# Patient Record
Sex: Male | Born: 1975
Health system: Southern US, Community
[De-identification: ages and names within clinical notes are randomized; demographics above are authoritative.]

## PROBLEM LIST (undated history)

## (undated) DIAGNOSIS — S3421XA Injury of nerve root of lumbar spine, initial encounter: Secondary | ICD-10-CM

## (undated) DIAGNOSIS — G4730 Sleep apnea, unspecified: Secondary | ICD-10-CM

## (undated) HISTORY — DX: Sleep apnea, unspecified: G47.30

## (undated) HISTORY — DX: Injury of nerve root of lumbar spine, initial encounter: S34.21XA

---

## 2006-03-26 ENCOUNTER — Encounter: Admission: RE | Admit: 2006-03-26 | Discharge: 2006-03-26 | Payer: Self-pay | Admitting: Family Medicine

## 2007-02-17 ENCOUNTER — Ambulatory Visit: Payer: Self-pay | Admitting: Orthopedic Surgery

## 2014-10-23 ENCOUNTER — Emergency Department: Payer: Self-pay | Admitting: Emergency Medicine

## 2014-10-26 ENCOUNTER — Ambulatory Visit: Payer: Self-pay | Admitting: Family Medicine

## 2014-11-08 ENCOUNTER — Ambulatory Visit: Payer: Self-pay | Admitting: Urology

## 2014-11-15 ENCOUNTER — Ambulatory Visit: Admit: 2014-11-15 | Disposition: A | Discharge status: Home / Self Care | Payer: Self-pay | Admitting: Urology

## 2016-10-15 ENCOUNTER — Emergency Department: Payer: BLUE CROSS/BLUE SHIELD

## 2016-10-15 ENCOUNTER — Emergency Department
Admission: EM | Admit: 2016-10-15 | Discharge: 2016-10-15 | Disposition: A | Discharge status: Home / Self Care | Payer: BLUE CROSS/BLUE SHIELD | Attending: Emergency Medicine | Admitting: Emergency Medicine

## 2016-10-15 ENCOUNTER — Encounter: Payer: Self-pay | Admitting: Family Medicine

## 2016-10-15 ENCOUNTER — Encounter: Payer: Self-pay | Admitting: Emergency Medicine

## 2016-10-15 ENCOUNTER — Ambulatory Visit (INDEPENDENT_AMBULATORY_CARE_PROVIDER_SITE_OTHER): Payer: BLUE CROSS/BLUE SHIELD | Admitting: Family Medicine

## 2016-10-15 VITALS — BP 114/73 | HR 90 | Temp 98.3°F | Ht 66.5 in | Wt 232.9 lb

## 2016-10-15 DIAGNOSIS — R079 Chest pain, unspecified: Secondary | ICD-10-CM

## 2016-10-15 DIAGNOSIS — Z7982 Long term (current) use of aspirin: Secondary | ICD-10-CM | POA: Insufficient documentation

## 2016-10-15 DIAGNOSIS — R0902 Hypoxemia: Secondary | ICD-10-CM

## 2016-10-15 DIAGNOSIS — R202 Paresthesia of skin: Secondary | ICD-10-CM | POA: Diagnosis not present

## 2016-10-15 DIAGNOSIS — R6883 Chills (without fever): Secondary | ICD-10-CM | POA: Diagnosis not present

## 2016-10-15 DIAGNOSIS — Z13220 Encounter for screening for lipoid disorders: Secondary | ICD-10-CM

## 2016-10-15 DIAGNOSIS — F17290 Nicotine dependence, other tobacco product, uncomplicated: Secondary | ICD-10-CM | POA: Insufficient documentation

## 2016-10-15 LAB — BASIC METABOLIC PANEL
Anion gap: 8 (ref 5–15)
BUN: 18 mg/dL (ref 6–20)
CHLORIDE: 104 mmol/L (ref 101–111)
CO2: 27 mmol/L (ref 22–32)
CREATININE: 1.04 mg/dL (ref 0.61–1.24)
Calcium: 9.2 mg/dL (ref 8.9–10.3)
GFR calc non Af Amer: >60 mL/min (ref >60)
Glucose, Bld: 115 mg/dL — ABNORMAL HIGH (ref 65–99)
Potassium: 3.8 mmol/L (ref 3.5–5.1)
Sodium: 139 mmol/L (ref 135–145)

## 2016-10-15 LAB — CBC
HEMATOCRIT: 42.6 % (ref 40.0–52.0)
HEMOGLOBIN: 14.7 g/dL (ref 13.0–18.0)
MCH: 29.9 pg (ref 26.0–34.0)
MCHC: 34.5 g/dL (ref 32.0–36.0)
MCV: 86.8 fL (ref 80.0–100.0)
PLATELETS: 300 10*3/uL (ref 150–440)
RBC: 4.91 MIL/uL (ref 4.40–5.90)
RDW: 12.5 % (ref 11.5–14.5)
WBC: 9.7 10*3/uL (ref 3.8–10.6)

## 2016-10-15 LAB — UA/M W/RFLX CULTURE, ROUTINE
Bilirubin, UA: NEGATIVE
GLUCOSE, UA: NEGATIVE
Ketones, UA: NEGATIVE
Leukocytes, UA: NEGATIVE
Nitrite, UA: NEGATIVE
Protein, UA: NEGATIVE
Specific Gravity, UA: 1.01 (ref 1.005–1.030)
UUROB: 0.2 mg/dL (ref 0.2–1.0)
pH, UA: 5.5 (ref 5.0–7.5)

## 2016-10-15 LAB — TROPONIN I
Troponin I: <0.03 ng/mL (ref <0.03)
Troponin I: <0.03 ng/mL (ref <0.03)

## 2016-10-15 LAB — BAYER DCA HB A1C WAIVED: HB A1C (BAYER DCA - WAIVED): 5.4 % (ref <7.0)

## 2016-10-15 MED ORDER — ASPIRIN 325 MG PO TABS: 325.0000 mg | ORAL_TABLET | Freq: Once | ORAL | Status: AC

## 2016-10-15 NOTE — ED Provider Notes (Signed)
[facility] Emergency Department Provider Note  ____________________________________________  Time seen: Approximately 7:29 PM  I have reviewed the triage vital signs and the nursing notes.   HISTORY  Chief Complaint Chest Pain   HPI John Willis is a 41 y.o. male with history of obstructive sleep apnea on CPAP who presents from primary care's office for evaluation of EKG changes. Patient went there today for chest pain. Patient reports that since Christmas he has had 3 episodes of left-sided chest pain that he describes as a sharp pain located in his chest radiating down to his left arm. 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 The pain then resolved and he has had 2 other episodes happen while he was working exerting himself. Patient reports that he works out lifting heavy weights and aerobic exercises 3-4 times a week and has no chest pain or shortness of breath while working out. He denies family history of ischemic heart disease. He says that he smokes a cigar once every couple weeks. He denies any chest pain today. His last episode of chest pain was 3 days ago. He went to see primary care doctor today who did an EKG showing T-wave inversions in inferior lateral leads and patient was sent here for further evaluation. Patient denies having any associated symptoms at the chest pain such as diaphoresis, nausea, vomiting, shortness of breath or dizziness. Patient is also complaining of episodes where he wakes up in the middle of the night feeling like his body temperature has dropped to below 0. He shivers and has to get into a hot shower for 45 minutes to stop the shivering. He denies unintentional weight loss. He did live in [place] a kid. Denies any other exposure to TB such as jail for homeless shelters. He has been having these episodes for 2 years and reports that he has them couple times a  year. The last one was also 3 days ago concurrent with the chest pain.No hemoptysis, no SOB, no flulike symptoms.  Past Medical History:  Diagnosis Date  . 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 Sleep apnea     There are no active problems to display for this patient.   History reviewed. No pertinent surgical history.  Prior to Admission medications   Medication Sig Start Date End Date Taking? Authorizing Provider  ibuprofen (ADVIL,MOTRIN) 800 MG tablet Take by mouth.    Historical Provider, MD    Allergies Patient has no known allergies.  Family History  Problem Relation Age of Onset  . Heart disease Paternal Grandfather     Social History Social History  Substance Use Topics  . Smoking status: Light Tobacco Smoker    Types: Cigars  . Smokeless tobacco: Never Used  . Alcohol use Yes     Comment: Occassionally    Review of Systems  Constitutional: Negative for fever. Eyes: Negative for visual changes. ENT: Negative for sore throat. Neck: No neck pain  Cardiovascular: + chest pain. Respiratory: Negative for shortness of breath. Gastrointestinal: Negative for abdominal pain, vomiting or diarrhea. Genitourinary: Negative for dysuria. Musculoskeletal: Negative for back pain. Skin: Negative for rash. Neurological: Negative for headaches, weakness or numbness. Psych: No SI or HI  ____________________________________________   PHYSICAL EXAM:  VITAL SIGNS: ED Triage Vitals  Enc Vitals Group     BP 10/15/16 1713 136/78     Pulse  Rate 10/15/16 1713 87     Resp 10/15/16 1713 18     Temp 10/15/16 1713 98.2 F (36.8 C)     Temp Source 10/15/16 1713 Oral     SpO2 10/15/16 1713 95 %     Weight 10/15/16 1715 230 lb (104.3 kg)     Height 10/15/16 1715 5\' 6"  (1.676 m)     Head Circumference --      Peak Flow --      Pain Score --      Pain Loc --      Pain Edu? --      Excl. in GC? --     Constitutional: Alert and oriented. Well appearing and in no  apparent distress. HEENT:      Head: Normocephalic and atraumatic.         Eyes: Conjunctivae are normal. Sclera is non-icteric. EOMI. PERRL      Mouth/Throat: Mucous membranes are moist.       Neck: Supple with no signs of meningismus. Cardiovascular: Regular rate and rhythm. No murmurs, gallops, or rubs. 2+ symmetrical distal pulses are present in all extremities. No JVD.  Respiratory: Normal respiratory effort. Lungs are clear to auscultation bilaterally. No wheezes, crackles, or rhonchi.  Gastrointestinal: Soft, non tender, and non distended with positive bowel sounds. No rebound or guarding. Musculoskeletal: Nontender with normal range of motion in all extremities. No edema, cyanosis, or erythema of extremities. Neurologic: Normal speech and language. Face is symmetric. Moving all extremities. No gross focal neurologic deficits are appreciated. Skin: Skin is warm, dry and intact. No rash noted. Psychiatric: Mood and affect are normal. Speech and behavior are normal.  ____________________________________________   LABS (all labs ordered are listed, but only abnormal results are displayed)  Labs Reviewed  BASIC METABOLIC PANEL - Abnormal; Notable for the following:       Result Value   Glucose, Bld 115 (*)    All other components within normal limits  CBC  TROPONIN I  TROPONIN I   ____________________________________________  EKG  ED ECG REPORT I, Nita Sickle, the attending physician, personally viewed and interpreted this ECG.  Normal sinus rhythm, rate of 77, normal intervals, normal axis, no ST elevations or depressions, flattening T wave in lead 3. When compared to EKG from primary care's office T-wave inversions in 1, 2, and aVL are no longer seen ____________________________________________  RADIOLOGY  CXR: Negative  ____________________________________________   PROCEDURES  Procedure(s) performed: None Procedures Critical Care performed:   None ____________________________________________   INITIAL IMPRESSION / ASSESSMENT AND PLAN / ED COURSE   41 y.o. male with history of obstructive sleep apnea on CPAP who presents from primary care's office for evaluation of EKG changes in the setting of 3 episodes of atypical chest pain. No chest pain for the last 3 days. Patient has EKG from the PCPs office and I do see T-wave inversions on 1,2 and aVL that are no longer present upon arrival to the emergency room. The EKG was done while patient was asymptomatic. His troponin x2 is negative. His vital signs are within normal limits. Physical exam is with no acute findings. No CP in the ED. Discussed patient's presentation, EKG findings, lab work, and history with Dr. Gwen Pounds, cardiologist on call who recommended outpatient follow up in his office tomorrow morning. Patient given full dose ASA at PCP's office. Will dc home with close follow-up with cardiology and return to the emergency room if patient develops chest pain.  Pertinent labs & imaging results that were available during my care of the patient were reviewed by me and considered in my medical decision making (see chart for details).    ____________________________________________   FINAL CLINICAL IMPRESSION(S) / ED DIAGNOSES  Final diagnoses:  Chest pain, unspecified type      NEW MEDICATIONS STARTED DURING THIS VISIT:  New Prescriptions   No medications on file     Note:  This document was prepared using Dragon voice recognition software and may include unintentional dictation errors.    Nita Sicklearolina Leona Alen, MD 10/15/16 2102
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
## 2016-10-15 NOTE — ED Triage Notes (Signed)
Pt reports intermittent left sided chest pain that radiates to left arm for several months. Pt reports he notices the pain more when he has been very active. Pt reports night sweats.

## 2016-10-15 NOTE — Discharge Instructions (Signed)

## 2016-10-16 LAB — CBC WITH DIFFERENTIAL/PLATELET
BASOS ABS: 0 10*3/uL (ref 0.0–0.2)
BASOS: 0 %
EOS (ABSOLUTE): 0.2 10*3/uL (ref 0.0–0.4)
Eos: 2 %
Hematocrit: 43.3 % (ref 37.5–51.0)
Hemoglobin: 14.7 g/dL (ref 13.0–17.7)
Immature Grans (Abs): 0 10*3/uL (ref 0.0–0.1)
Immature Granulocytes: 0 %
LYMPHS ABS: 2 10*3/uL (ref 0.7–3.1)
LYMPHS: 20 %
MCH: 29.5 pg (ref 26.6–33.0)
MCHC: 33.9 g/dL (ref 31.5–35.7)
MCV: 87 fL (ref 79–97)
MONOS ABS: 0.7 10*3/uL (ref 0.1–0.9)
Monocytes: 7 %
NEUTROS ABS: 7.1 10*3/uL — AB (ref 1.4–7.0)
Neutrophils: 71 %
PLATELETS: 327 10*3/uL (ref 150–379)
RBC: 4.98 x10E6/uL (ref 4.14–5.80)
RDW: 13.3 % (ref 12.3–15.4)
WBC: 10.1 10*3/uL (ref 3.4–10.8)

## 2016-10-16 LAB — THYROID PANEL WITH TSH
Free Thyroxine Index: 3.4 (ref 1.2–4.9)
T3 UPTAKE RATIO: 35 % (ref 24–39)
T4 TOTAL: 9.7 ug/dL (ref 4.5–12.0)
TSH: 1.78 u[IU]/mL (ref 0.450–4.500)

## 2016-10-16 LAB — COMPREHENSIVE METABOLIC PANEL
A/G RATIO: 1.9 (ref 1.2–2.2)
ALK PHOS: 87 IU/L (ref 39–117)
ALT: 32 IU/L (ref 0–44)
AST: 28 IU/L (ref 0–40)
Albumin: 4.7 g/dL (ref 3.5–5.5)
BILIRUBIN TOTAL: 0.3 mg/dL (ref 0.0–1.2)
BUN/Creatinine Ratio: 15 (ref 9–20)
BUN: 16 mg/dL (ref 6–24)
CHLORIDE: 98 mmol/L (ref 96–106)
CO2: 25 mmol/L (ref 18–29)
Calcium: 9.6 mg/dL (ref 8.7–10.2)
Creatinine, Ser: 1.05 mg/dL (ref 0.76–1.27)
GFR calc Af Amer: 101 mL/min/{1.73_m2} (ref >59)
GFR calc non Af Amer: 88 mL/min/{1.73_m2} (ref >59)
GLUCOSE: 91 mg/dL (ref 65–99)
Globulin, Total: 2.5 g/dL (ref 1.5–4.5)
POTASSIUM: 4.2 mmol/L (ref 3.5–5.2)
Sodium: 139 mmol/L (ref 134–144)
Total Protein: 7.2 g/dL (ref 6.0–8.5)

## 2016-10-16 LAB — LIPID PANEL W/O CHOL/HDL RATIO
CHOLESTEROL TOTAL: 192 mg/dL (ref 100–199)
HDL: 36 mg/dL — AB (ref >39)
Triglycerides: 411 mg/dL — ABNORMAL HIGH (ref 0–149)

## 2016-10-20 DIAGNOSIS — G4733 Obstructive sleep apnea (adult) (pediatric): Secondary | ICD-10-CM | POA: Insufficient documentation

## 2020-11-13 ENCOUNTER — Ambulatory Visit
Admission: RE | Admit: 2020-11-13 | Discharge: 2020-11-13 | Disposition: A | Discharge status: Home / Self Care | Payer: BC Managed Care – PPO | Source: Home / Self Care | Attending: Nurse Practitioner | Admitting: Nurse Practitioner

## 2020-11-13 ENCOUNTER — Other Ambulatory Visit: Payer: Self-pay

## 2020-11-13 ENCOUNTER — Ambulatory Visit (INDEPENDENT_AMBULATORY_CARE_PROVIDER_SITE_OTHER): Payer: BC Managed Care – PPO | Admitting: Nurse Practitioner

## 2020-11-13 ENCOUNTER — Ambulatory Visit
Admission: RE | Admit: 2020-11-13 | Discharge: 2020-11-13 | Disposition: A | Discharge status: Home / Self Care | Payer: BC Managed Care – PPO | Source: Ambulatory Visit | Attending: Nurse Practitioner | Admitting: Nurse Practitioner

## 2020-11-13 ENCOUNTER — Encounter: Payer: Self-pay | Admitting: Nurse Practitioner

## 2020-11-13 VITALS — BP 137/83 | HR 82 | Temp 97.4°F | Wt 248.6 lb

## 2020-11-13 DIAGNOSIS — G8929 Other chronic pain: Secondary | ICD-10-CM | POA: Diagnosis not present

## 2020-11-13 DIAGNOSIS — M47814 Spondylosis without myelopathy or radiculopathy, thoracic region: Secondary | ICD-10-CM | POA: Diagnosis not present

## 2020-11-13 DIAGNOSIS — M546 Pain in thoracic spine: Secondary | ICD-10-CM | POA: Diagnosis not present

## 2020-11-13 DIAGNOSIS — Z6841 Body Mass Index (BMI) 40.0 and over, adult: Secondary | ICD-10-CM

## 2020-11-13 DIAGNOSIS — R296 Repeated falls: Secondary | ICD-10-CM | POA: Diagnosis not present

## 2020-11-13 DIAGNOSIS — R0781 Pleurodynia: Secondary | ICD-10-CM | POA: Diagnosis not present

## 2020-11-13 DIAGNOSIS — E669 Obesity, unspecified: Secondary | ICD-10-CM | POA: Insufficient documentation

## 2020-11-13 MED ORDER — METHOCARBAMOL 500 MG PO TABS: 500.0000 mg | ORAL_TABLET | Freq: Four times a day (QID) | ORAL | 0 refills | Status: AC | PRN

## 2020-11-13 NOTE — Progress Notes (Signed)
BP 137/83   Pulse 82   Temp (!) 97.4 F (36.3 C) (Oral)   Wt 248 lb 9.6 oz (112.8 kg)   SpO2 96%   BMI 40.13 kg/m    Subjective:    Patient ID: John Willis, male    DOB: December 18, 1975, 45 y.o.   MRN: [id number]  HPI: John Willis is a 45 y.o. male  Chief Complaint  Patient presents with  . Back Pain    Patient states he is following up from a fall back in July and he has tried [facility] massage therapy and states that has helped and with his job he would like to discuss next steps as he still having pain.   BACK PAIN History of fall back in July 2021 and 2nd fall in December 2021 -- wearing Crocs both times and fell backwards.  When landed was either flat down or to right mid-back.  Has tried chiropractor and massage therapist, without much benefit since that time.  Chiropractor did not do imaging.   Duration: months Mechanism of injury: falls Location: right mid-back and then shooting pains to lower Onset: sudden Severity: 2/10 at best then 5/10 worst  Quality: shooting, aching, dull Frequency: dull pain constant and sharp pain intermittent with movement Radiation: none Aggravating factors: lifting, movement, walking and bending Alleviating factors: heat and NSAIDs Status: fluctuating  Treatments attempted: heat and ibuprofen , chiropractor, TENS, and massage Relief with NSAIDs?: moderate Nighttime pain:  no Paresthesias / decreased sensation:  no Bowel / bladder incontinence:  no Fevers:  no Dysuria / urinary frequency:  no  Relevant past medical, surgical, family and social history reviewed and updated as indicated. Interim medical history since our last visit reviewed. Allergies and medications reviewed and updated.  Review of Systems  Constitutional: Negative.   Respiratory: Negative.   Cardiovascular: Negative.   Musculoskeletal: Positive for back pain.  Neurological: Negative.   Psychiatric/Behavioral: Negative.     Per HPI unless specifically indicated  above     Objective:    BP 137/83   Pulse 82   Temp (!) 97.4 F (36.3 C) (Oral)   Wt 248 lb 9.6 oz (112.8 kg)   SpO2 96%   BMI 40.13 kg/m   Wt Readings from Last 3 Encounters:  11/13/20 248 lb 9.6 oz (112.8 kg)  10/15/16 230 lb (104.3 kg)  10/15/16 232 lb 14.4 oz (105.6 kg)    Physical Exam Vitals and nursing note reviewed.  Constitutional:      General: He is awake. He is not in acute distress.    Appearance: He is well-developed and well-groomed. He is morbidly obese. He is not ill-appearing.  HENT:     Head: Normocephalic and atraumatic.     Right Ear: Hearing normal. No drainage.     Left Ear: Hearing normal. No drainage.  Eyes:     General: Lids are normal.        Right eye: No discharge.        Left eye: No discharge.     Conjunctiva/sclera: Conjunctivae normal.     Pupils: Pupils are equal, round, and reactive to light.  Neck:     Trachea: Trachea normal.  Cardiovascular:     Rate and Rhythm: Normal rate and regular rhythm.     Heart sounds: Normal heart sounds, S1 normal and S2 normal. No murmur heard. No gallop.   Pulmonary:     Effort: Pulmonary effort is normal. No accessory muscle usage or respiratory distress.  Breath sounds: Normal breath sounds.  Abdominal:     General: Bowel sounds are normal.     Palpations: Abdomen is soft. There is no hepatomegaly or splenomegaly.  Musculoskeletal:        General: Normal range of motion.     Cervical back: Normal range of motion and neck supple.     Thoracic back: Spasms and tenderness present. No swelling, edema, signs of trauma or bony tenderness. Normal range of motion.       Back:     Right lower leg: No edema.     Left lower leg: No edema.     Comments: Full ROM present, but discomfort noted with flexion, none with extension.  Lateral bend noted discomfort to left and rotation discomfort right and left.    Skin:    General: Skin is warm and dry.     Capillary Refill: Capillary refill takes less than 2  seconds.     Findings: No rash.  Neurological:     Mental Status: He is alert and oriented to person, place, and time.     Deep Tendon Reflexes: Reflexes are normal and symmetric.     Reflex Scores:      Brachioradialis reflexes are 2+ on the right side and 2+ on the left side.      Patellar reflexes are 2+ on the right side and 2+ on the left side. Psychiatric:        Attention and Perception: Attention normal.        Mood and Affect: Mood normal.        Speech: Speech normal.        Behavior: Behavior normal. Behavior is cooperative.        Thought Content: Thought content normal.    Results for orders placed or performed during the hospital encounter of 10/15/16  Basic metabolic panel  Result Value Ref Range   Sodium 139 135 - 145 mmol/L   Potassium 3.8 3.5 - 5.1 mmol/L   Chloride 104 101 - 111 mmol/L   CO2 27 22 - 32 mmol/L   Glucose, Bld 115 (H) 65 - 99 mg/dL   BUN 18 6 - 20 mg/dL   Creatinine, Ser 1.57 0.61 - 1.24 mg/dL   Calcium 9.2 8.9 - 26.2 mg/dL   GFR calc non Af Amer >60 >60 mL/min   GFR calc Af Amer >60 >60 mL/min   Anion gap 8 5 - 15  CBC  Result Value Ref Range   WBC 9.7 3.8 - 10.6 K/uL   RBC 4.91 4.40 - 5.90 MIL/uL   Hemoglobin 14.7 13.0 - 18.0 g/dL   HCT 03.5 59.7 - 41.6 %   MCV 86.8 80.0 - 100.0 fL   MCH 29.9 26.0 - 34.0 pg   MCHC 34.5 32.0 - 36.0 g/dL   RDW 38.4 53.6 - 46.8 %   Platelets 300 150 - 440 K/uL  Troponin I  Result Value Ref Range   Troponin I <0.03 <0.03 ng/mL  Troponin I  Result Value Ref Range   Troponin I <0.03 <0.03 ng/mL      Assessment & Plan:   Problem List Items Addressed This Visit      Other   Chronic right-sided thoracic back pain    Ongoing now since July 2021 when had a fall, second fall in December 2021.  At this time will order imaging thoracic spine and right ribs to further assess.  Suspect more musculoskeletal in presentation.  Has failed treatment  with chiropractor and massage therapy.  At this time will trial  Robaxin as needed for muscle pain and recommend he use OTC Voltaren gel.  Continue Tylenol as needed and Aleeve if needed.  Alternate heat and ice + use TENS unit.  If ongoing will consider referral to PT for further evaluation.  Return in 4 weeks.       Relevant Medications   methocarbamol (ROBAXIN) 500 MG tablet   Other Relevant Orders   DG Thoracic Spine W/Swimmers   DG Ribs Unilateral Right   Obesity - Primary    BMI 40.13.  Recommended eating smaller high protein, low fat meals more frequently and exercising 30 mins a day 5 times a week with a goal of 10-15lb weight loss in the next 3 months. Patient voiced their understanding and motivation to adhere to these recommendations.           Follow up plan: Return in about 4 weeks (around 12/11/2020) for Back pain.

## 2020-11-13 NOTE — Assessment & Plan Note (Signed)
BMI 40.13.  Recommended eating smaller high protein, low fat meals more frequently and exercising 30 mins a day 5 times a week with a goal of 10-15lb weight loss in the next 3 months. Patient voiced their understanding and motivation to adhere to these recommendations.

## 2020-11-13 NOTE — Assessment & Plan Note (Signed)
Ongoing now since July 2021 when had a fall, second fall in December 2021.  At this time will order imaging thoracic spine and right ribs to further assess.  Suspect more musculoskeletal in presentation.  Has failed treatment with chiropractor and massage therapy.  At this time will trial Robaxin as needed for muscle pain and recommend he use OTC Voltaren gel.  Continue Tylenol as needed and Aleeve if needed.  Alternate heat and ice + use TENS unit.  If ongoing will consider referral to PT for further evaluation.  Return in 4 weeks.

## 2020-11-13 NOTE — Patient Instructions (Signed)
Acute Back Pain, Adult Acute back pain is sudden and usually short-lived. It is often caused by an injury to the muscles and tissues in the back. The injury may result from:  A muscle or ligament getting overstretched or torn (strained). Ligaments are tissues that connect bones to each other. Lifting something improperly can cause a back strain.  Wear and tear (degeneration) of the spinal disks. Spinal disks are circular tissue that provide cushioning between the bones of the spine (vertebrae).  Twisting motions, such as while playing sports or doing yard work.  A hit to the back.  Arthritis. You may have a physical exam, lab tests, and imaging tests to find the cause of your pain. Acute back pain usually goes away with rest and home care. Follow these instructions at home: Managing pain, stiffness, and swelling  Treatment may include medicines for pain and inflammation that are taken by mouth or applied to the skin, prescription pain medicine, or muscle relaxants. Take over-the-counter and prescription medicines only as told by your health care provider.  Your health care provider may recommend applying ice during the first 24-48 hours after your pain starts. To do this: ? Put ice in a plastic bag. ? Place a towel between your skin and the bag. ? Leave the ice on for 20 minutes, 2-3 times a day.  If directed, apply heat to the affected area as often as told by your health care provider. Use the heat source that your health care provider recommends, such as a moist heat pack or a heating pad. ? Place a towel between your skin and the heat source. ? Leave the heat on for 20-30 minutes. ? Remove the heat if your skin turns bright red. This is especially important if you are unable to feel pain, heat, or cold. You have a greater risk of getting burned. Activity  Do not stay in bed. Staying in bed for more than 1-2 days can delay your recovery.  Sit up and stand up straight. Avoid leaning  forward when you sit or hunching over when you stand. ? If you work at a desk, sit close to it so you do not need to lean over. Keep your chin tucked in. Keep your neck drawn back, and keep your elbows bent at a 90-degree angle (right angle). ? Sit high and close to the steering wheel when you drive. Add lower back (lumbar) support to your car seat, if needed.  Take short walks on even surfaces as soon as you are able. Try to increase the length of time you walk each day.  Do not sit, drive, or stand in one place for more than 30 minutes at a time. Sitting or standing for long periods of time can put stress on your back.  Do not drive or use heavy machinery while taking prescription pain medicine.  Use proper lifting techniques. When you bend and lift, use positions that put less stress on your back: ? Bend your knees. ? Keep the load close to your body. ? Avoid twisting.  Exercise regularly as told by your health care provider. Exercising helps your back heal faster and helps prevent back injuries by keeping muscles strong and flexible.  Work with a physical therapist to make a safe exercise program, as recommended by your health care provider. Do any exercises as told by your physical therapist.   Lifestyle  Maintain a healthy weight. Extra weight puts stress on your back and makes it difficult to have   good posture.  Avoid activities or situations that make you feel anxious or stressed. Stress and anxiety increase muscle tension and can make back pain worse. Learn ways to manage anxiety and stress, such as through exercise. General instructions  Sleep on a firm mattress in a comfortable position. Try lying on your side with your knees slightly bent. If you lie on your back, put a pillow under your knees.  Follow your treatment plan as told by your health care provider. This may include: ? Cognitive or behavioral therapy. ? Acupuncture or massage therapy. ? Meditation or yoga. Contact  a health care provider if:  You have pain that is not relieved with rest or medicine.  You have increasing pain going down into your legs or buttocks.  Your pain does not improve after 2 weeks.  You have pain at night.  You lose weight without trying.  You have a fever or chills. Get help right away if:  You develop new bowel or bladder control problems.  You have unusual weakness or numbness in your arms or legs.  You develop nausea or vomiting.  You develop abdominal pain.  You feel faint. Summary  Acute back pain is sudden and usually short-lived.  Use proper lifting techniques. When you bend and lift, use positions that put less stress on your back.  Take over-the-counter and prescription medicines and apply heat or ice as directed by your health care provider. This information is not intended to replace advice given to you by your health care provider. Make sure you discuss any questions you have with your health care provider. Document Revised: 05/17/2020 Document Reviewed: 05/17/2020 Elsevier Patient Education  2021 [employer]  

## 2020-11-15 NOTE — Progress Notes (Signed)
Contacted via MyChart   Good morning Clement, your imaging has returned and overall no fractures (old or new) seen.  Some mild degenerative changes of thoracic spine, but this was not caused by falls, it was most likely already there.  Overall normal rib and thoracic spine imaging.  Suspect this is more ongoing muscle irritation.  Continue muscle relaxer as needed and return as scheduled.  May benefit from physical therapy if this continues.  Any questions? Keep being awesome!!  Thank you for allowing me to participate in your care. Kindest regards, Kortlyn Koltz

## 2020-12-10 ENCOUNTER — Ambulatory Visit (INDEPENDENT_AMBULATORY_CARE_PROVIDER_SITE_OTHER): Payer: BC Managed Care – PPO | Admitting: Nurse Practitioner

## 2020-12-10 ENCOUNTER — Ambulatory Visit: Payer: BC Managed Care – PPO | Admitting: Internal Medicine

## 2020-12-10 ENCOUNTER — Other Ambulatory Visit: Payer: Self-pay

## 2020-12-10 ENCOUNTER — Encounter: Payer: Self-pay | Admitting: Nurse Practitioner

## 2020-12-10 VITALS — BP 136/82 | HR 85 | Temp 98.6°F | Wt 247.0 lb

## 2020-12-10 DIAGNOSIS — R5383 Other fatigue: Secondary | ICD-10-CM | POA: Insufficient documentation

## 2020-12-10 DIAGNOSIS — M546 Pain in thoracic spine: Secondary | ICD-10-CM

## 2020-12-10 DIAGNOSIS — L309 Dermatitis, unspecified: Secondary | ICD-10-CM | POA: Insufficient documentation

## 2020-12-10 DIAGNOSIS — Z114 Encounter for screening for human immunodeficiency virus [HIV]: Secondary | ICD-10-CM | POA: Diagnosis not present

## 2020-12-10 DIAGNOSIS — Z13220 Encounter for screening for lipoid disorders: Secondary | ICD-10-CM | POA: Diagnosis not present

## 2020-12-10 DIAGNOSIS — R6883 Chills (without fever): Secondary | ICD-10-CM | POA: Diagnosis not present

## 2020-12-10 DIAGNOSIS — Z6839 Body mass index (BMI) 39.0-39.9, adult: Secondary | ICD-10-CM

## 2020-12-10 DIAGNOSIS — L2084 Intrinsic (allergic) eczema: Secondary | ICD-10-CM

## 2020-12-10 DIAGNOSIS — G8929 Other chronic pain: Secondary | ICD-10-CM

## 2020-12-10 DIAGNOSIS — Z1159 Encounter for screening for other viral diseases: Secondary | ICD-10-CM | POA: Diagnosis not present

## 2020-12-10 DIAGNOSIS — E6609 Other obesity due to excess calories: Secondary | ICD-10-CM

## 2020-12-10 DIAGNOSIS — G4733 Obstructive sleep apnea (adult) (pediatric): Secondary | ICD-10-CM

## 2020-12-10 MED ORDER — TRIAMCINOLONE ACETONIDE 0.1 % EX CREA: 1.0000 "application " | TOPICAL_CREAM | Freq: Two times a day (BID) | CUTANEOUS | 4 refills | Status: AC

## 2020-12-10 NOTE — Assessment & Plan Note (Signed)
BMI 39.87. Recommended eating smaller high protein, low fat meals more frequently and exercising 30 mins a day 5 times a week with a goal of 10-15lb weight loss in the next 3 months. Patient voiced their understanding and motivation to adhere to these recommendations.

## 2020-12-10 NOTE — Assessment & Plan Note (Signed)
Improving.  Suspect more musculoskeletal in presentation. At this time will continue Robaxin as needed for muscle pain and recommend he use OTC Voltaren gel.  Continue Tylenol as needed and Aleeve if needed.  Alternate heat and ice + use TENS unit.  If ongoing will consider referral to PT for further evaluation.  Return in 4 weeks.

## 2020-12-10 NOTE — Assessment & Plan Note (Signed)
Diagnosed in past, does not use CPAP.  Discussed with him that he may benefit from repeat testing and possible use of Inspire if did not tolerate mask.

## 2020-12-10 NOTE — Assessment & Plan Note (Signed)
Refer to intermittent chills plan of care, check labs today.

## 2020-12-10 NOTE — Assessment & Plan Note (Signed)
Ongoing with intermittent flares, check labs today.  Triamcinolone cream sent in to use as needed.

## 2020-12-10 NOTE — Assessment & Plan Note (Signed)
Ongoing for 20 years, with negative testing in past.  Also has underlying eczema intermittent.  Labs today to include CBC, CMP, TSH, A1c, Quantiferon, ESR, CRP, ANA, Hep C, and HIV.  Return in 4 weeks to further discuss and assess.

## 2020-12-10 NOTE — Patient Instructions (Signed)

## 2020-12-10 NOTE — Progress Notes (Signed)
BP 136/82   Pulse 85   Temp 98.6 F (37 C) (Oral)   Wt 247 lb (112 kg)   SpO2 98%   BMI 39.87 kg/m    Subjective:    Patient ID: John Willis, male    DOB: November 18, 1975, 45 y.o.   MRN: [id number]  HPI: John Willis is a 45 y.o. male  Chief Complaint  Patient presents with  . Back Pain    Patient states the back pain is still there, but may have figured out the problem.   . Night Sweats    Patient states here lately he has been having "night chills/sweats" to where when he is cold he has to crawl to the shower and run hot water on him to get warm. Patient states when he has the "night sweats" he just has to thrown the covers back and change his clothes and sheets on bed. Patient states his face turns bright red as well during the day.  . Dry Skin    Patient states he noticed his feet are becoming extremely dry on his feet to where he notices them cracking from dryness. Patient states it comes and goes and he usually soaks them in epsom salt to help with it.    BACK PAIN Follow-up for back pain, initially seen 11/13/20.  History of fall back in July 2021 and 2nd fall in December 2021 -- wearing Crocs both times and fell backwards.  When landed was either flat down or to right mid-back.  Has tried chiropractor and massage therapist, without much benefit since that time.  Recent imaging noted no fractures.  Overall he feels like it is improving slowly, has reduced Advil use.   Duration: months Mechanism of injury: falls Location: right mid-back and then shooting pains to lower Onset: sudden Severity: 1-2/10 Quality: shooting, aching, dull Frequency: dull pain constant and sharp pain intermittent with movement Radiation: none Aggravating factors: lifting, movement, walking and bending Alleviating factors: heat and NSAIDs Status: fluctuating  Treatments attempted: heat and ibuprofen , chiropractor, TENS, and massage Relief with NSAIDs?: moderate Nighttime pain:  no Paresthesias / decreased  sensation:  no Bowel / bladder incontinence:  no Fevers:  no Dysuria / urinary frequency:  no   INTERMITTENT CHILLS & DRY SKIN Have been present on and off for 20 years.  Gets random chills and shivering.  Can happen at any time, not just at night.  Has always happened at his house. In past was checked for diabetes 3 times in past after discussing with providers, but this has returned normal -- last A1c in 2018 was 5.4%.    Has been diagnosed in past with OSA, but never used CPAP -- could not use.  Worked out and lost weight instead.  Does endorse lots of stressors in life at job and fatigue daily.    Has had issues with dry skin over past 2-3 years -- worst in the feet area with occasional fissures.  At present no issues with this.  Using epsom salt at home and cream.  Notices more patches with heat.  In past used a cream for this.  Smokes occasional cigars. Recurrent headaches: no Visual changes: no Palpitations: no Dyspnea: no Chest pain: no Lower extremity edema: no Dizzy/lightheaded: no  Relevant past medical, surgical, family and social history reviewed and updated as indicated. Interim medical history since our last visit reviewed. Allergies and medications reviewed and updated.  Review of Systems  Constitutional: Negative.   Respiratory: Negative.  Cardiovascular: Negative.   Musculoskeletal: Positive for back pain.  Neurological: Negative.   Psychiatric/Behavioral: Negative.     Per HPI unless specifically indicated above     Objective:    BP 136/82   Pulse 85   Temp 98.6 F (37 C) (Oral)   Wt 247 lb (112 kg)   SpO2 98%   BMI 39.87 kg/m   Wt Readings from Last 3 Encounters:  12/10/20 247 lb (112 kg)  11/13/20 248 lb 9.6 oz (112.8 kg)  10/15/16 230 lb (104.3 kg)    Physical Exam Vitals and nursing note reviewed.  Constitutional:      General: He is awake. He is not in acute distress.    Appearance: He is well-developed and well-groomed. He is morbidly  obese. He is not ill-appearing.  HENT:     Head: Normocephalic and atraumatic.     Right Ear: Hearing normal. No drainage.     Left Ear: Hearing normal. No drainage.  Eyes:     General: Lids are normal.        Right eye: No discharge.        Left eye: No discharge.     Conjunctiva/sclera: Conjunctivae normal.     Pupils: Pupils are equal, round, and reactive to light.  Neck:     Trachea: Trachea normal.  Cardiovascular:     Rate and Rhythm: Normal rate and regular rhythm.     Heart sounds: Normal heart sounds, S1 normal and S2 normal. No murmur heard. No gallop.   Pulmonary:     Effort: Pulmonary effort is normal. No accessory muscle usage or respiratory distress.     Breath sounds: Normal breath sounds.  Abdominal:     General: Bowel sounds are normal.     Palpations: Abdomen is soft. There is no hepatomegaly or splenomegaly.  Musculoskeletal:        General: Normal range of motion.     Cervical back: Normal range of motion.     Thoracic back: No swelling, edema, signs of trauma or bony tenderness. Normal range of motion.     Right lower leg: No edema.     Left lower leg: No edema.     Comments:    Skin:    General: Skin is warm and dry.     Capillary Refill: Capillary refill takes less than 2 seconds.     Findings: Rash present. Rash is scaling.     Comments: To bilateral knees noted, red base with scaly appearance to upper aspect.  Neurological:     Mental Status: He is alert and oriented to person, place, and time.     Deep Tendon Reflexes: Reflexes are normal and symmetric.     Reflex Scores:      Brachioradialis reflexes are 2+ on the right side and 2+ on the left side.      Patellar reflexes are 2+ on the right side and 2+ on the left side. Psychiatric:        Attention and Perception: Attention normal.        Mood and Affect: Mood normal.        Speech: Speech normal.        Behavior: Behavior normal. Behavior is cooperative.        Thought Content: Thought  content normal.    Results for orders placed or performed during the hospital encounter of 16/10/96  Basic metabolic panel  Result Value Ref Range   Sodium 139 135 - 145 mmol/L  Potassium 3.8 3.5 - 5.1 mmol/L   Chloride 104 101 - 111 mmol/L   CO2 27 22 - 32 mmol/L   Glucose, Bld 115 (H) 65 - 99 mg/dL   BUN 18 6 - 20 mg/dL   Creatinine, Ser 1.04 0.61 - 1.24 mg/dL   Calcium 9.2 8.9 - 10.3 mg/dL   GFR calc non Af Amer >60 >60 mL/min   GFR calc Af Amer >60 >60 mL/min   Anion gap 8 5 - 15  CBC  Result Value Ref Range   WBC 9.7 3.8 - 10.6 K/uL   RBC 4.91 4.40 - 5.90 MIL/uL   Hemoglobin 14.7 13.0 - 18.0 g/dL   HCT 42.6 40.0 - 52.0 %   MCV 86.8 80.0 - 100.0 fL   MCH 29.9 26.0 - 34.0 pg   MCHC 34.5 32.0 - 36.0 g/dL   RDW 12.5 11.5 - 14.5 %   Platelets 300 150 - 440 K/uL  Troponin I  Result Value Ref Range   Troponin I <0.03 <0.03 ng/mL  Troponin I  Result Value Ref Range   Troponin I <0.03 <0.03 ng/mL      Assessment & Plan:   Problem List Items Addressed This Visit      Respiratory   OSA (obstructive sleep apnea)    Diagnosed in past, does not use CPAP.  Discussed with him that he may benefit from repeat testing and possible use of Inspire if did not tolerate mask.        Musculoskeletal and Integument   Eczema    Ongoing with intermittent flares, check labs today.  Triamcinolone cream sent in to use as needed.          Other   Chronic right-sided thoracic back pain - Primary    Improving.  Suspect more musculoskeletal in presentation. At this time will continue Robaxin as needed for muscle pain and recommend he use OTC Voltaren gel.  Continue Tylenol as needed and Aleeve if needed.  Alternate heat and ice + use TENS unit.  If ongoing will consider referral to PT for further evaluation.  Return in 4 weeks.       Obesity    BMI 39.87. Recommended eating smaller high protein, low fat meals more frequently and exercising 30 mins a day 5 times a week with a goal of  10-15lb weight loss in the next 3 months. Patient voiced their understanding and motivation to adhere to these recommendations.       Chills without fever    Ongoing for 20 years, with negative testing in past.  Also has underlying eczema intermittent.  Labs today to include CBC, CMP, TSH, A1c, Quantiferon, ESR, CRP, ANA, Hep C, and HIV.  Return in 4 weeks to further discuss and assess.      Relevant Orders   Comprehensive metabolic panel   CBC with Differential/Platelet   TSH   C-reactive protein   Sed Rate (ESR)   ANA w/Reflex if Positive   Hemoglobin A1c   QuantiFERON-TB Gold Plus   Fatigue    Refer to intermittent chills plan of care, check labs today.      Relevant Orders   CBC with Differential/Platelet   TSH   VITAMIN D 25 Hydroxy (Vit-D Deficiency, Fractures)   Hemoglobin A1c    Other Visit Diagnoses    Need for hepatitis C screening test       Hep C screening today, per guideline recommendations.   Relevant Orders   Hepatitis C antibody  Encounter for screening for HIV       HIV screening today, per guideline recommendations.   Relevant Orders   HIV Antibody (routine testing w rflx)   Screening cholesterol level       Lipid panel today, has not had performed since 2018.   Relevant Orders   Lipid Panel w/o Chol/HDL Ratio       Follow up plan: Return in about 4 weeks (around 01/07/2021) for DRY SKIN AND CHILLS FOLLOW-UP.

## 2020-12-11 LAB — SEDIMENTATION RATE: Sed Rate: 4 mm/hr (ref 0–15)

## 2020-12-11 LAB — CBC WITH DIFFERENTIAL/PLATELET
Basophils Absolute: 0 10*3/uL (ref 0.0–0.2)
Basos: 0 %
EOS (ABSOLUTE): 0.2 10*3/uL (ref 0.0–0.4)
Eos: 2 %
Hematocrit: 44 % (ref 37.5–51.0)
Hemoglobin: 15.2 g/dL (ref 13.0–17.7)
Immature Grans (Abs): 0 10*3/uL (ref 0.0–0.1)
Immature Granulocytes: 0 %
Lymphocytes Absolute: 1.6 10*3/uL (ref 0.7–3.1)
Lymphs: 19 %
MCH: 29.9 pg (ref 26.6–33.0)
MCHC: 34.5 g/dL (ref 31.5–35.7)
MCV: 86 fL (ref 79–97)
Monocytes Absolute: 0.5 10*3/uL (ref 0.1–0.9)
Monocytes: 6 %
Neutrophils Absolute: 6.1 10*3/uL (ref 1.4–7.0)
Neutrophils: 73 %
Platelets: 313 10*3/uL (ref 150–450)
RBC: 5.09 x10E6/uL (ref 4.14–5.80)
RDW: 12.8 % (ref 11.6–15.4)
WBC: 8.5 10*3/uL (ref 3.4–10.8)

## 2020-12-11 LAB — VITAMIN D 25 HYDROXY (VIT D DEFICIENCY, FRACTURES): Vit D, 25-Hydroxy: 20.2 ng/mL — ABNORMAL LOW (ref 30.0–100.0)

## 2020-12-11 LAB — COMPREHENSIVE METABOLIC PANEL
ALT: 37 IU/L (ref 0–44)
AST: 28 IU/L (ref 0–40)
Albumin/Globulin Ratio: 1.9 (ref 1.2–2.2)
Albumin: 4.6 g/dL (ref 4.0–5.0)
Alkaline Phosphatase: 88 IU/L (ref 44–121)
BUN/Creatinine Ratio: 15 (ref 9–20)
BUN: 15 mg/dL (ref 6–24)
Bilirubin Total: 0.5 mg/dL (ref 0.0–1.2)
CO2: 20 mmol/L (ref 20–29)
Calcium: 9.8 mg/dL (ref 8.7–10.2)
Chloride: 102 mmol/L (ref 96–106)
Creatinine, Ser: 1.02 mg/dL (ref 0.76–1.27)
Globulin, Total: 2.4 g/dL (ref 1.5–4.5)
Glucose: 88 mg/dL (ref 65–99)
Potassium: 4.3 mmol/L (ref 3.5–5.2)
Sodium: 140 mmol/L (ref 134–144)
Total Protein: 7 g/dL (ref 6.0–8.5)
eGFR: 92 mL/min/{1.73_m2} (ref >59)

## 2020-12-11 LAB — HEPATITIS C ANTIBODY: Hep C Virus Ab: <0.1 {s_co_ratio} (ref 0.0–0.9)

## 2020-12-11 LAB — TSH: TSH: 2.08 u[IU]/mL (ref 0.450–4.500)

## 2020-12-11 LAB — LIPID PANEL W/O CHOL/HDL RATIO
Cholesterol, Total: 169 mg/dL (ref 100–199)
HDL: 36 mg/dL — ABNORMAL LOW (ref >39)
LDL Chol Calc (NIH): 76 mg/dL (ref 0–99)
Triglycerides: 355 mg/dL — ABNORMAL HIGH (ref 0–149)
VLDL Cholesterol Cal: 57 mg/dL — ABNORMAL HIGH (ref 5–40)

## 2020-12-11 LAB — ANA W/REFLEX IF POSITIVE: Anti Nuclear Antibody (ANA): NEGATIVE

## 2020-12-11 LAB — HEMOGLOBIN A1C
Est. average glucose Bld gHb Est-mCnc: 120 mg/dL
Hgb A1c MFr Bld: 5.8 % — ABNORMAL HIGH (ref 4.8–5.6)

## 2020-12-11 LAB — C-REACTIVE PROTEIN: CRP: 2 mg/L (ref 0–10)

## 2020-12-11 LAB — HIV ANTIBODY (ROUTINE TESTING W REFLEX): HIV Screen 4th Generation wRfx: NONREACTIVE

## 2020-12-11 NOTE — Progress Notes (Signed)
Contacted via MyChart   Good evening John Willis, labs have returned and overall everything looks normal with exception of Vitamin D level which is on low side at 20.2, would like to see >30.  This is good for bone and muscle health.  I would recommend you start taking Vitamin D3 2000 units daily and we will recheck in a few months.  Cholesterol levels show a normal LDL, but triglycerides are slightly elevated at 355, some downward trend from previous of 411.  I would recommend heavy focus on diet.  Can take some fish oil 1g daily.  Also should reduce saturated fats and sugars in diet.  Any questions? Keep being awesome!!  Thank you for allowing me to participate in your care. Kindest regards, Darl Kuss

## 2020-12-12 ENCOUNTER — Encounter: Payer: Self-pay | Admitting: Nurse Practitioner

## 2020-12-12 DIAGNOSIS — Z8249 Family history of ischemic heart disease and other diseases of the circulatory system: Secondary | ICD-10-CM | POA: Insufficient documentation

## 2021-01-07 ENCOUNTER — Telehealth: Payer: Self-pay | Admitting: Internal Medicine

## 2021-01-07 NOTE — Telephone Encounter (Signed)
error 

## 2021-01-08 ENCOUNTER — Ambulatory Visit: Payer: BC Managed Care – PPO | Admitting: Internal Medicine

## 2021-01-08 ENCOUNTER — Ambulatory Visit: Payer: BC Managed Care – PPO | Admitting: Family Medicine

## 2022-11-22 IMAGING — DX DG RIBS 2V*R*
4 series · 4 of 4 positions shown · non-contrast
Comparison: None.

CLINICAL DATA: Multiple falls, right rib pain

EXAM:
RIGHT RIBS - 2 VIEW

[rib ap (1 of 2)]
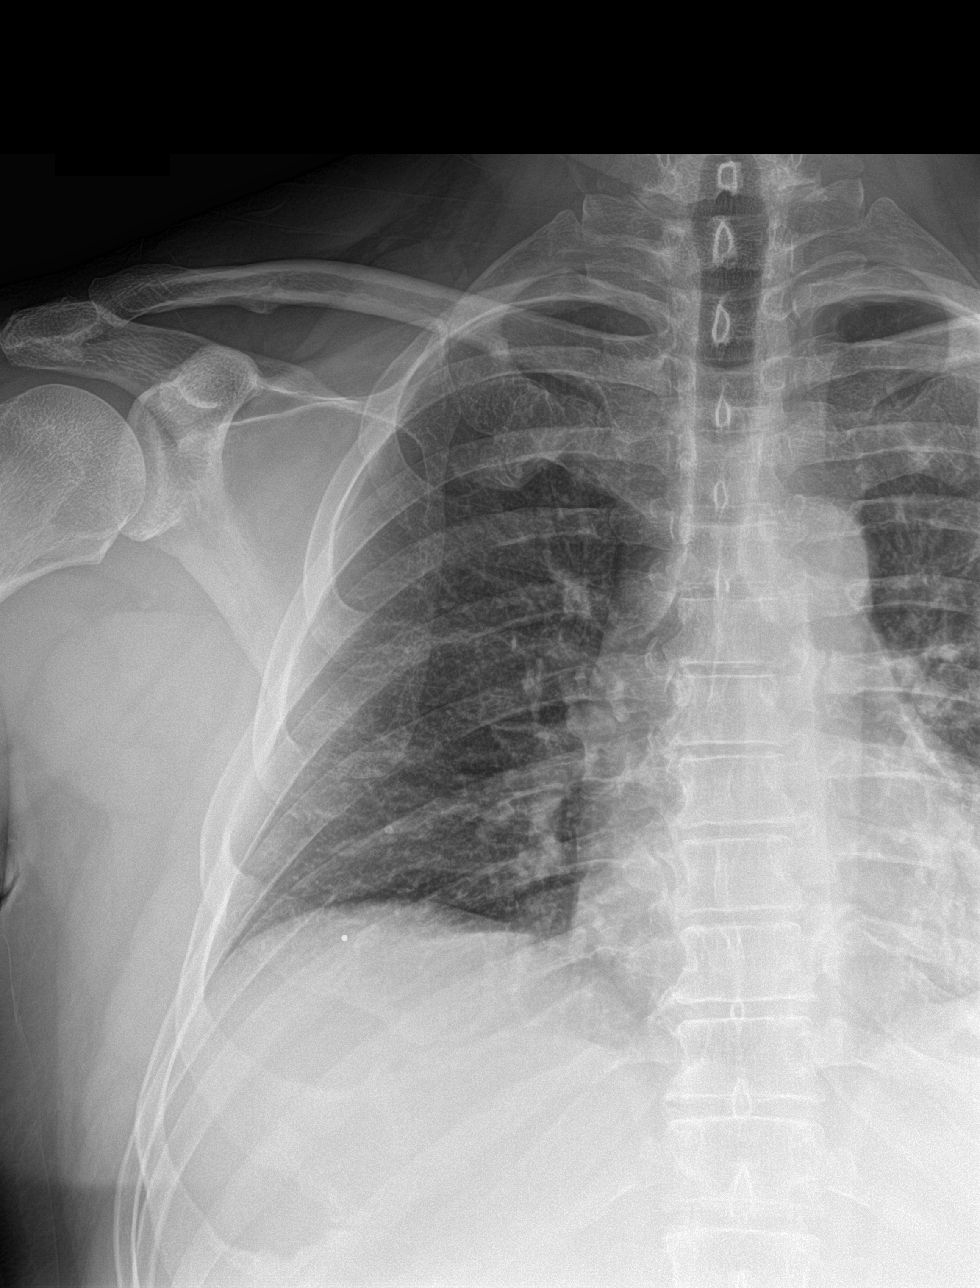

[rib ap (2 of 2)]
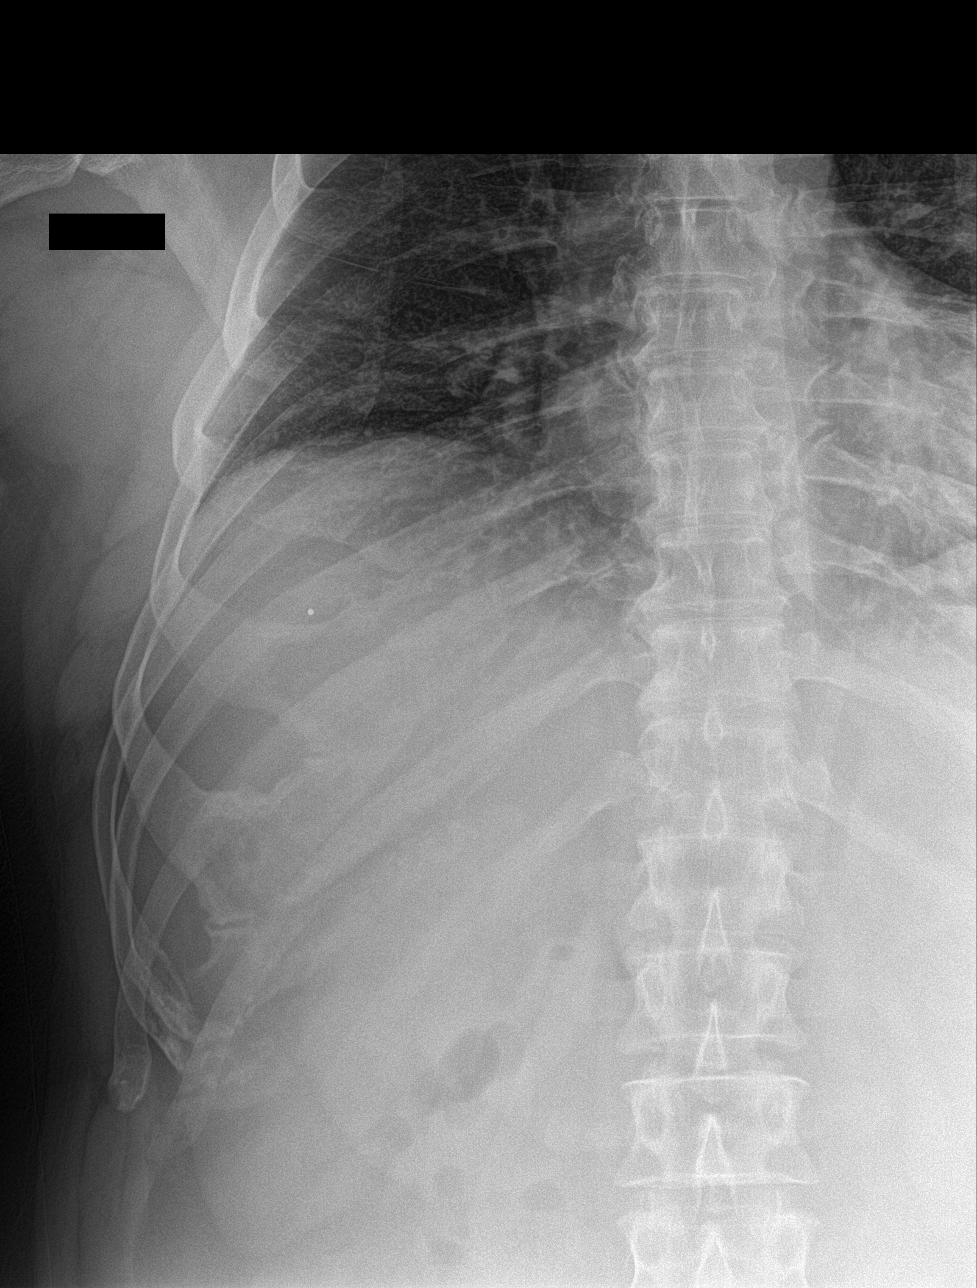

[rib obl (1 of 2)]
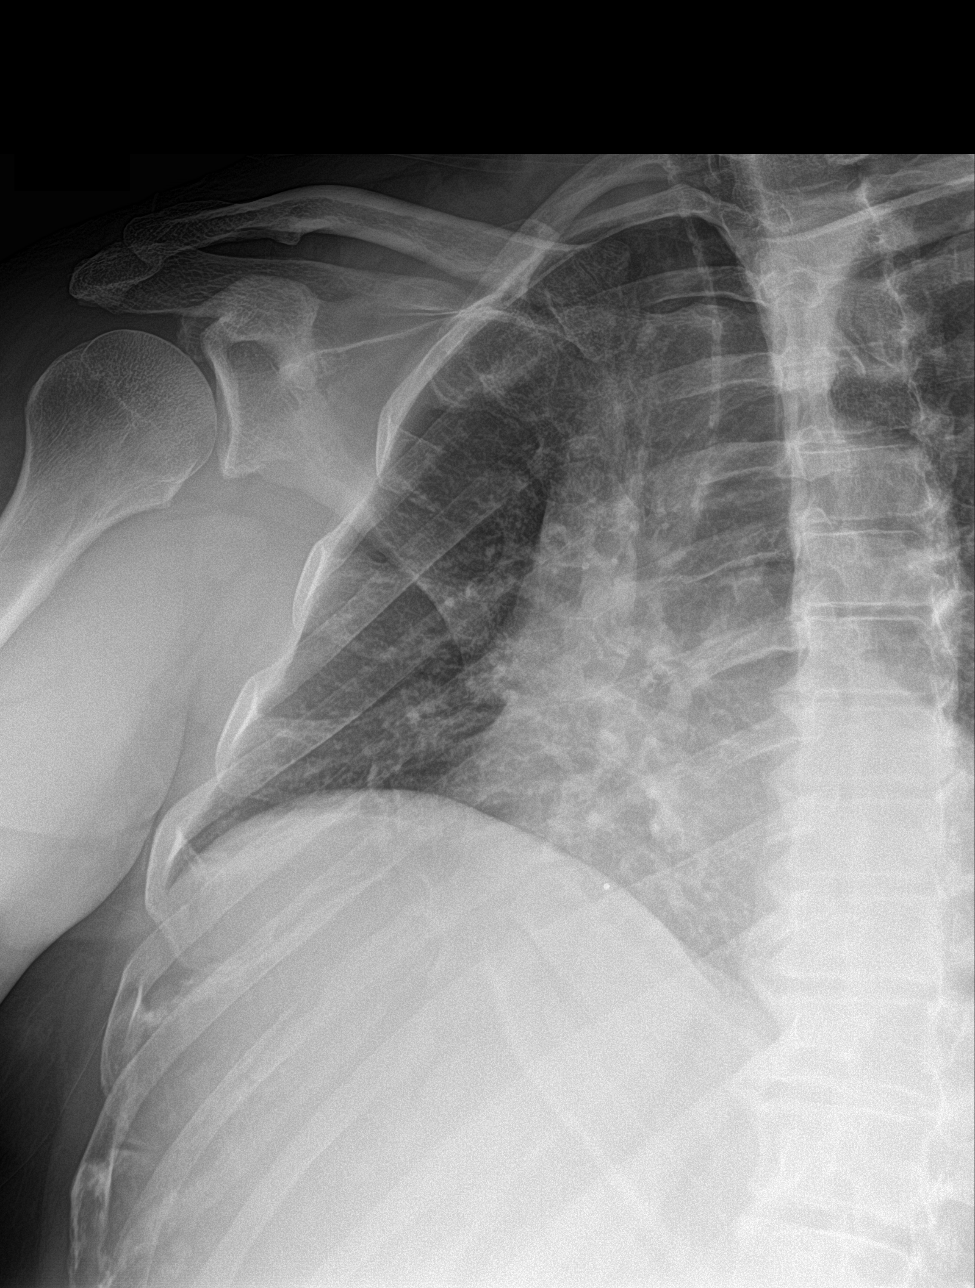

[rib obl (2 of 2)]
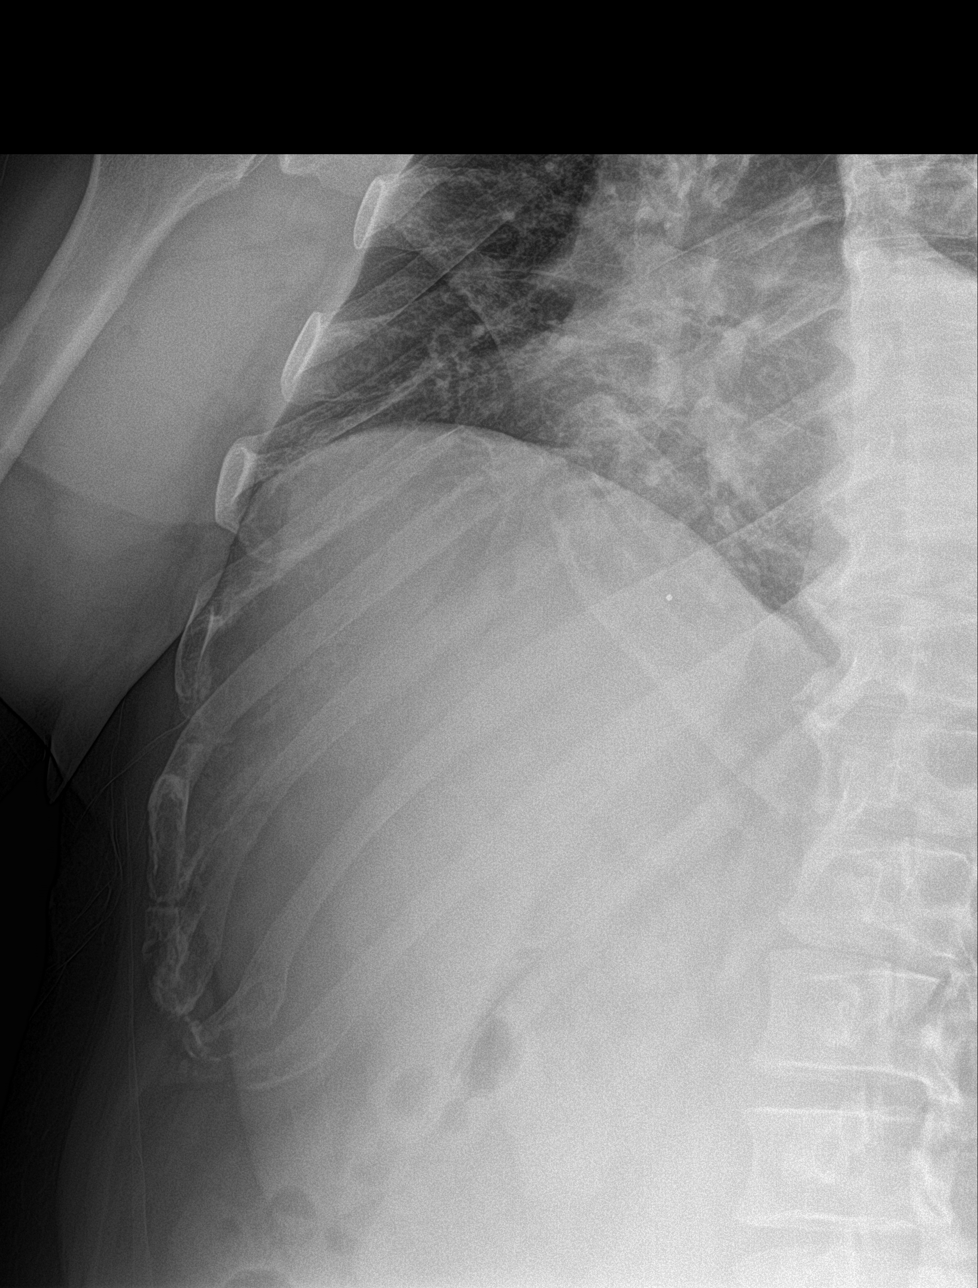

[4 of 4 positions shown; findings below may reference images not displayed]

FINDINGS: No fracture or other bone lesions are seen involving the ribs.
IMPRESSION: Negative.
